# Patient Record
Sex: Male | Born: 1990 | Race: Black or African American | Hispanic: No | Marital: Single | Smoking: Never smoker
Health system: Southern US, Community
[De-identification: ages and names within clinical notes are randomized; demographics above are authoritative.]

## PROBLEM LIST (undated history)

## (undated) DIAGNOSIS — J45909 Unspecified asthma, uncomplicated: Secondary | ICD-10-CM

## (undated) HISTORY — DX: Unspecified asthma, uncomplicated: J45.909

---
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
## 2018-04-03 ENCOUNTER — Ambulatory Visit: Payer: Self-pay | Admitting: Family Medicine

## 2018-04-17 ENCOUNTER — Ambulatory Visit: Payer: Self-pay | Admitting: Family Medicine

## 2018-05-01 ENCOUNTER — Ambulatory Visit: Payer: Federal, State, Local not specified - PPO | Admitting: Family Medicine

## 2018-05-01 ENCOUNTER — Encounter: Payer: Self-pay | Admitting: Family Medicine

## 2018-05-01 VITALS — BP 120/80 | HR 64 | Temp 98.2°F | Ht 71.0 in | Wt 197.0 lb

## 2018-05-01 DIAGNOSIS — Z131 Encounter for screening for diabetes mellitus: Secondary | ICD-10-CM | POA: Diagnosis not present

## 2018-05-01 DIAGNOSIS — Z13220 Encounter for screening for lipoid disorders: Secondary | ICD-10-CM

## 2018-05-01 DIAGNOSIS — Z1388 Encounter for screening for disorder due to exposure to contaminants: Secondary | ICD-10-CM | POA: Diagnosis not present

## 2018-05-01 DIAGNOSIS — Z0000 Encounter for general adult medical examination without abnormal findings: Secondary | ICD-10-CM

## 2018-05-01 DIAGNOSIS — M25811 Other specified joint disorders, right shoulder: Secondary | ICD-10-CM

## 2018-05-01 LAB — BASIC METABOLIC PANEL
BUN: 15 mg/dL (ref 6–23)
CO2: 29 mEq/L (ref 19–32)
Calcium: 9.6 mg/dL (ref 8.4–10.5)
Chloride: 103 mEq/L (ref 96–112)
Creatinine, Ser: 1.21 mg/dL (ref 0.40–1.50)
GFR: 90.94 mL/min (ref >60.00)
Glucose, Bld: 92 mg/dL (ref 70–99)
Potassium: 4.3 mEq/L (ref 3.5–5.1)
Sodium: 140 mEq/L (ref 135–145)

## 2018-05-01 LAB — LIPID PANEL
Cholesterol: 152 mg/dL (ref 0–200)
HDL: 62.2 mg/dL (ref >39.00)
LDL Cholesterol: 82 mg/dL (ref 0–99)
NonHDL: 89.3
Total CHOL/HDL Ratio: 2
Triglycerides: 37 mg/dL (ref 0.0–149.0)
VLDL: 7.4 mg/dL (ref 0.0–40.0)

## 2018-05-01 LAB — CBC
HCT: 44.6 % (ref 39.0–52.0)
Hemoglobin: 14.7 g/dL (ref 13.0–17.0)
MCHC: 33 g/dL (ref 30.0–36.0)
MCV: 99 fl (ref 78.0–100.0)
Platelets: 183 10*3/uL (ref 150.0–400.0)
RBC: 4.51 Mil/uL (ref 4.22–5.81)
RDW: 12.7 % (ref 11.5–15.5)
WBC: 3.7 10*3/uL — ABNORMAL LOW (ref 4.0–10.5)

## 2018-05-01 LAB — HEMOGLOBIN A1C: Hgb A1c MFr Bld: 4.8 % (ref 4.6–6.5)

## 2018-05-01 NOTE — Progress Notes (Signed)
Subjective:     Jared Grant is a 27 y.o. male and is here for a comprehensive physical exam. The patient reports problems - R shoulder click.  Pt notes R shoulder clicking and soreness with overuse.  No pain or discomfort with regular activity.  Pt does not recall initial injury, but states he works out often and does MMA.  Pt has not taken anything for his symptoms.  Allergies: NKDA  Past Surg Hx:  Wisdom teeth extraction  Social Hx: Pt is single.  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  Pt recently moved to the area from [place], [place].  He is currently employed by the [employer].  Pt expresses concerns about his lead levels as he has been doing a lot of shooting at the range.  Pt endorses social EtOH use.  Pt denies tobacco and drug use.  Social History   Socioeconomic History  . Marital status: Single    Spouse name: Not on file  . Number of children: Not on file  . Years of education: Not on file  . Highest education level: Not on file  Occupational History  . Not on file  Social Needs  . Financial resource strain: Not on file  . Food insecurity:    Worry: Not on file    Inability: Not on file  . Transportation needs:    Medical: Not on file    Non-medical: Not on file  Tobacco Use  . Smoking status: Never Smoker  . Smokeless tobacco: Never Used  Substance and Sexual Activity  . Alcohol use: Yes  . Drug use: Never  . Sexual activity: Yes  Lifestyle  . Physical activity:    Days per week: Not on file    Minutes per session: Not on file  . Stress: Not on file  Relationships  . Social connections:    Talks on phone: Not on file    Gets together: Not on file    Attends religious service: Not on file    Active member of club or organization: Not on file    Attends meetings of clubs or organizations: Not on file    Relationship status: Not on file  . Intimate partner violence:    Fear of current or ex partner: Not on file    Emotionally abused: Not on file    Physically  abused: Not on file    Forced sexual activity: Not on file  Other Topics Concern  . Not on file  Social History Narrative  . Not on file   Health Maintenance  Topic Date Due  . HIV Screening  12/14/2003  . TETANUS/TDAP  12/14/2007  . INFLUENZA VACCINE  02/20/2018    The following portions of the patient's history were reviewed and updated as appropriate: allergies, current medications, past family history, past medical history, past social history, past surgical history and problem list.  Review of Systems Pertinent items noted in HPI and remainder of comprehensive ROS otherwise negative.   Objective:    BP 120/80 (BP Location: Left Arm, Patient Position: Sitting, Cuff Size: Normal)   Pulse 64   Temp 98.2 F (36.8 C) (Oral)   Ht 5\' 11"  (1.803 m)   Wt 197 lb (89.4 kg)   SpO2 99%   BMI 27.48 kg/m  General appearance: alert, cooperative, appears stated age and no distress Head: Normocephalic, without obvious abnormality, atraumatic Eyes: conjunctivae/corneas clear. PERRL, EOM's intact. Fundi benign. Ears: normal TM's and external ear canals both ears Nose:  Nares normal. Septum midline. Mucosa normal. No drainage or sinus tenderness. Throat: lips, mucosa, and tongue normal; teeth and gums normal Neck: no adenopathy, no carotid bruit, no JVD, supple, symmetrical, trachea midline and thyroid not enlarged, symmetric, no tenderness/mass/nodules Lungs: clear to auscultation bilaterally Heart: regular rate and rhythm, S1, S2 normal, no murmur, click, rub or gallop Abdomen: soft, non-tender; bowel sounds normal; no masses,  no organomegaly Extremities: extremities normal, atraumatic, no cyanosis or edema Skin: Skin color, texture, turgor normal. No rashes or lesions Neurologic: Alert and oriented X 3, normal strength and tone. Normal symmetric reflexes. Normal coordination and gait    Assessment:    Healthy male exam.      Plan:     Anticipatory guidance given including wearing  seatbelts, smoke detectors in the home, increasing physical activity, increasing p.o. intake of water and vegetables. -will obtain labs: CBC, BMP, lipids, Hgb A1C -offered influenza vaccine, pt declined -given handout See After Visit Summary for Counseling Recommendations    Lead Screen -will screen given increased exposure at range  R shoulder click -normal exam -discussed various causes of clicking including labrum tear -will do conservative management including NSAIDs, Ice, stretching/exercises -discussed imaging for continued or worsened symptoms  F/u prn  Abbe Amsterdam, MD

## 2018-05-01 NOTE — Patient Instructions (Addendum)
Preventive Care 18-39 Years, Male Preventive care refers to lifestyle choices and visits with your health care provider that can promote health and wellness. What does preventive care include?  A yearly physical exam. This is also called an annual well check.  Dental exams once or twice a year.  Routine eye exams. Ask your health care provider how often you should have your eyes checked.  Personal lifestyle choices, including: ? Daily care of your teeth and gums. ? Regular physical activity. ? Eating a healthy diet. ? Avoiding tobacco and drug use. ? Limiting alcohol use. ? Practicing safe sex. What happens during an annual well check? The services and screenings done by your health care provider during your annual well check will depend on your age, overall health, lifestyle risk factors, and family history of disease. Counseling Your health care provider may ask you questions about your:  Alcohol use.  Tobacco use.  Drug use.  Emotional well-being.  Home and relationship well-being.  Sexual activity.  Eating habits.  Work and work Statistician.  Screening You may have the following tests or measurements:  Height, weight, and BMI.  Blood pressure.  Lipid and cholesterol levels. These may be checked every 5 years starting at age 58.  Diabetes screening. This is done by checking your blood sugar (glucose) after you have not eaten for a while (fasting).  Skin check.  Hepatitis C blood test.  Hepatitis B blood test.  Sexually transmitted disease (STD) testing.  Discuss your test results, treatment options, and if necessary, the need for more tests with your health care provider. Vaccines Your health care provider may recommend certain vaccines, such as:  Influenza vaccine. This is recommended every year.  Tetanus, diphtheria, and acellular pertussis (Tdap, Td) vaccine. You may need a Td booster every 10 years.  Varicella vaccine. You may need this if you  have not been vaccinated.  HPV vaccine. If you are 44 or younger, you may need three doses over 6 months.  Measles, mumps, and rubella (MMR) vaccine. You may need at least one dose of MMR.You may also need a second dose.  Pneumococcal 13-valent conjugate (PCV13) vaccine. You may need this if you have certain conditions and have not been vaccinated.  Pneumococcal polysaccharide (PPSV23) vaccine. You may need one or two doses if you smoke cigarettes or if you have certain conditions.  Meningococcal vaccine. One dose is recommended if you are age 30-21 years and a first-year college student living in a residence hall, or if you have one of several medical conditions. You may also need additional booster doses.  Hepatitis A vaccine. You may need this if you have certain conditions or if you travel or work in places where you may be exposed to hepatitis A.  Hepatitis B vaccine. You may need this if you have certain conditions or if you travel or work in places where you may be exposed to hepatitis B.  Haemophilus influenzae type b (Hib) vaccine. You may need this if you have certain risk factors.  Talk to your health care provider about which screenings and vaccines you need and how often you need them. This information is not intended to replace advice given to you by your health care provider. Make sure you discuss any questions you have with your health care provider. Document Released: 09/04/2001 Document Revised: 03/28/2016 Document Reviewed: 05/10/2015 Elsevier Interactive Patient Education  2018 [place] What is lead poisoning? Lead is a substance that occurs naturally  in the earth. It can be found in soil, air, and water. Lead is very poisonous (toxic) to humans. Lead poisoning occurs when there is enough lead in a person's body to be harmful to his or her health. A person can get lead poisoning from:  Lead paint.  Small particles of lead in the soil  or air.  Water that is contaminated with lead from the soil or from lead pipes.  Working at a job where lead is used.  Having hobbies that involve the use of lead.  Lead poisoning is usually diagnosed with a blood test. The amount of lead in the blood is measured in micrograms per deciliter (mcg/dL). A child with a blood level of 5 mcg/dL or higher may be at risk for lead poisoning. A high level for an adult is 45 mcg/dL. How does lead poisoning happen? Lead poisoning can happen when a person swallows or breathes in lead. It is possible to get lead poisoning from a single exposure to a high amount of lead. Usually, lead slowly builds up in the body over time until it reaches a dangerous level. Lead can affect many areas of the body. Products that are made with lead may get into the air or the soil. Lead that gets into the soil may get into drinking water. Lead is also used to make many commonly used products. What are some common sources of lead? Lead-based paint has been the most common source of lead for many years. Most paint no longer contains lead. However, houses that were built before 1978 may have been painted with lead-based paint. Sources of lead that result from this include:  Soil outside of old houses.  Dust or paint chips inside old houses.  Lead in soil and dust where old houses are being renovated or demolished.  Vacant lots where old buildings once stood.  Other possible sources of lead exposure include:  Old painted toys or furniture.  Bullets or fishing sinkers.  Pipes or faucets.  Stained glass windows.  Materials used in [employer], Teacher, adult education, glazing, Insurance claims handler.  Batteries.  Ceramics.  Pewter pitchers and dinnerware.  Hair dyes.  Air, soil, or water near industrial sites.  Areas where lead has been mined, smelted, or refined.  Who is at risk for lead poisoning? Children are most at risk for lead poisoning because:  They are more likely to eat  lead paint chips or put objects that contain lead into their mouths.  They absorb lead into their systems more quickly than adults do.  Their bodies and brains are still developing, so they are more vulnerable to the toxic effects of lead.  Pregnant women and babies in the womb also have a higher-than-normal risk for lead poisoning. Lead that builds up in the blood over many years is stored in the bones along with calcium. Pregnancy causes calcium to be released into the blood. Lead may also be released. This release of lead can be toxic to a pregnant woman and can also affect a developing baby. Adults may be more at risk if they work in jobs where lead exposure is common. These include house remodeling or demolition, welding, bridge or water tower maintenance, ammunition manufacturing, and jobs that involve regular exposure to car batteries. Adults may also have an increased risk of lead poisoning if they have hobbies that involve using lead for soldering or glazing. What are the symptoms of lead poisoning? Symptoms are different for children, pregnant women, and other adults. Symptoms in  children  Slowed growth.  Learning problems.  Hyperactivity or behavior problems.  Low red blood cell count (anemia).  Reduced hearing.  Kidney damage.  Seizures. Symptoms in pregnant women  Giving birth too early (prematurely).  Having a baby with a low birth weight. Symptoms in other adults  High blood pressure.  Kidney damage.  Infertility.  Nerve, muscle, or joint problems.  Irritability.  Memory loss or concentration problems. How is lead poisoning treated? Treatment includes removing the sources of lead in the environment. Lead can also be removed from the body by taking a type of medicine that binds to the lead so it can be eliminated in urine (chelation therapy). What can I do to prevent lead poisoning?  Have children tested for lead.  Have your house checked for lead paint,  especially if you live in a house or an apartment that was built before 1978.  Children and pregnant women should stay out of any house built before 1978 that is being renovated.  Do not let children play in the dirt, especially in vacant lots.  Do not store food or drink in ceramic pottery that may have lead glazes.  For drinking or cooking, use only cold water from your tap or use bottled water. Hot water has more dissolved lead.  Run tap water for a minute before using it for cooking or drinking.  Mop your floors often.  Wash your child's hands and face before he or she eats.  Wash any toys that children may put into their mouths.  Do not store alcoholic drinks in lead crystal decanters. Where can I find more information? Environmental Protection Agency: [employer] This information is not intended to replace advice given to you by your health care provider. Make sure you discuss any questions you have with your health care provider. Document Released: 08/16/2004 Document Revised: 06/04/2016 Document Reviewed: 07/14/2014 Elsevier Interactive Patient Education  2017 [employer].

## 2018-05-05 LAB — LEAD, BLOOD (ADULT >= 16 YRS): Lead: 3 ug/dL (ref <5)

## 2019-11-25 ENCOUNTER — Other Ambulatory Visit: Payer: Self-pay

## 2019-11-25 ENCOUNTER — Encounter: Payer: Self-pay | Admitting: Family Medicine

## 2019-11-25 ENCOUNTER — Ambulatory Visit (INDEPENDENT_AMBULATORY_CARE_PROVIDER_SITE_OTHER): Payer: Federal, State, Local not specified - PPO | Admitting: Family Medicine

## 2019-11-25 VITALS — BP 110/76 | HR 59 | Temp 97.7°F | Wt 207.0 lb

## 2019-11-25 DIAGNOSIS — J4520 Mild intermittent asthma, uncomplicated: Secondary | ICD-10-CM

## 2019-11-25 DIAGNOSIS — J302 Other seasonal allergic rhinitis: Secondary | ICD-10-CM

## 2019-11-25 MED ORDER — CETIRIZINE HCL 10 MG PO TABS: 10.0000 mg | ORAL_TABLET | Freq: Every day | ORAL | 11 refills | Status: AC

## 2019-11-25 MED ORDER — ALBUTEROL SULFATE HFA 108 (90 BASE) MCG/ACT IN AERS: 2.0000 | INHALATION_SPRAY | Freq: Four times a day (QID) | RESPIRATORY_TRACT | 5 refills | Status: AC | PRN

## 2019-11-25 NOTE — Progress Notes (Signed)
Subjective:    Patient ID: Jared Grant, male    DOB: Jul 22, 1991, 29 y.o.   MRN: [id number]  No chief complaint on file.   HPI Pt seen today for ongoing concern. Pt notes wheezing and occasional cough mostly at night.  May have symptoms few nights per week.  Pt has a h/o asthma with symptoms occurring mostly as a child.  Also notes allergy symptoms including post nasal drainage.  Not currently taking anything.  Past Medical History:  Diagnosis Date  . Asthma     No Known Allergies  ROS General: Denies fever, chills, night sweats, changes in weight, changes in appetite HEENT: Denies headaches, ear pain, changes in vision, rhinorrhea, sore throat +post nasal drainage CV: Denies CP, palpitations, SOB, orthopnea Pulm: Denies SOB   +cough, wheezing GI: Denies abdominal pain, nausea, vomiting, diarrhea, constipation GU: Denies dysuria, hematuria, frequency, vaginal discharge Msk: Denies muscle cramps, joint pains Neuro: Denies weakness, numbness, tingling Skin: Denies rashes, bruising Psych: Denies depression, anxiety, hallucinations     Objective:    Blood pressure 110/76, pulse (!) 59, temperature 97.7 F (36.5 C), temperature source Temporal, weight 207 lb (93.9 kg), SpO2 96 %.  Gen. Pleasant, well-nourished, in no distress, normal affect   HEENT: [place]/AT, face symmetric, conjunctiva clear, no scleral icterus, PERRLA, EOMI, nares patent without drainage, pharynx without erythema or exudate. TMs normal b/l. Lungs: no accessory muscle use, CTAB, no wheezes or rales Cardiovascular: RRR, no m/r/g, no peripheral edema Musculoskeletal: No deformities, no cyanosis or clubbing, normal tone Neuro:  A&Ox3, CN II-XII intact, normal gait Skin:  Warm, no lesions/ rash  Wt Readings from Last 3 Encounters:  05/01/18 197 lb (89.4 kg)    Lab Results  Component Value Date   WBC 3.7 (L) 05/01/2018   HGB 14.7 05/01/2018   HCT 44.6 05/01/2018   PLT 183.0 05/01/2018   GLUCOSE 92  05/01/2018   CHOL 152 05/01/2018   TRIG 37.0 05/01/2018   HDL 62.20 05/01/2018   LDLCALC 82 05/01/2018   NA 140 05/01/2018   K 4.3 05/01/2018   CL 103 05/01/2018   CREATININE 1.21 05/01/2018   BUN 15 05/01/2018   CO2 29 05/01/2018   HGBA1C 4.8 05/01/2018    Assessment/Plan:  Mild intermittent asthma without complication  -Discussed prevention including controlling allergy symptoms -Discussed other possible triggers including pet dander, smoke, dry air, etc. -Given handout -For continued or increased symptoms discussed daily preventive inhaler - Plan: albuterol (VENTOLIN HFA) 108 (90 Base) MCG/ACT inhaler  Seasonal allergies  -Discussed supportive care including local honey, nasal saline -We will try Zyrtec as needed - Plan: cetirizine (ZYRTEC) 10 MG tablet  F/u as needed in the next few weeks for CPE.  Abbe Amsterdam, MD

## 2019-11-25 NOTE — Patient Instructions (Addendum)
[email or web address]  Asthma, Adult  Asthma is a long-term (chronic) condition that causes recurrent episodes in which the airways become tight and narrow. The airways are the passages that lead from the nose and mouth down into the lungs. Asthma episodes, also called asthma attacks, can cause coughing, wheezing, shortness of breath, and chest pain. The airways can also fill with mucus. During an attack, it can be difficult to breathe. Asthma attacks can range from minor to life threatening. Asthma cannot be cured, but medicines and lifestyle changes can help control it and treat acute attacks. What are the causes? This condition is believed to be caused by inherited (genetic) and environmental factors, but its exact cause is not known. There are many things that can bring on an asthma attack or make asthma symptoms worse (triggers). Asthma triggers are different for each person. Common triggers include:  Mold.  Dust.  Cigarette smoke.  Cockroaches.  Things that can cause allergy symptoms (allergens), such as animal dander or pollen from trees or grass.  Air pollutants such as household cleaners, wood smoke, smog, or chemical odors.  Cold air, weather changes, and winds (which increase molds and pollen in the air).  Strong emotional expressions such as crying or laughing hard.  Stress.  Certain medicines (such as aspirin) or types of medicines (such as beta-blockers).  Sulfites in foods and drinks. Foods and drinks that may contain sulfites include dried fruit, potato chips, and sparkling grape juice.  Infections or inflammatory conditions such as the flu, a cold, or inflammation of the nasal membranes (rhinitis).  Gastroesophageal reflux disease (GERD).  Exercise or strenuous activity. What are the signs or symptoms? Symptoms of this condition may occur right after asthma is triggered or many hours later. Symptoms include:  Wheezing. This can  sound like whistling when you breathe.  Excessive nighttime or early morning coughing.  Frequent or severe coughing with a common cold.  Chest tightness.  Shortness of breath.  Tiredness (fatigue) with minimal activity. How is this diagnosed? This condition is diagnosed based on:  Your medical history.  A physical exam.  Tests, which may include: ? Lung function studies and pulmonary studies (spirometry). These tests can evaluate the flow of air in your lungs. ? Allergy tests. ? Imaging tests, such as X-rays. How is this treated? There is no cure for this condition, but treatment can help control your symptoms. Treatment for asthma usually involves:  Identifying and avoiding your asthma triggers.  Using medicines to control your symptoms. Generally, two types of medicines are used to treat asthma: ? Controller medicines. These help prevent asthma symptoms from occurring. They are usually taken every day. ? Fast-acting reliever or rescue medicines. These quickly relieve asthma symptoms by widening the narrow and tight airways. They are used as needed and provide short-term relief.  Using supplemental oxygen. This may be needed during a severe episode.  Using other medicines, such as: ? Allergy medicines, such as antihistamines, if your asthma attacks are triggered by allergens. ? Immune medicines (immunomodulators). These are medicines that help control the immune system.  Creating an asthma action plan. An asthma action plan is a written plan for managing and treating your asthma attacks. This plan includes: ? A list of your asthma triggers and how to avoid them. ? Information about when medicines should be taken and when their dosage should be changed. ? Instructions about using a device called a peak flow meter. A peak flow meter measures how well the lungs are working   and the severity of your asthma. It helps you monitor your condition. Follow these instructions at  home: Controlling your home environment Control your home environment in the following ways to help avoid triggers and prevent asthma attacks:  Change your heating and air conditioning filter regularly.  Limit your use of fireplaces and wood stoves.  Get rid of pests (such as roaches and mice) and their droppings.  Throw away plants if you see mold on them.  Clean floors and dust surfaces regularly. Use unscented cleaning products.  Try to have someone else vacuum for you regularly. Stay out of rooms while they are being vacuumed and for a short while afterward. If you vacuum, use a dust mask from a hardware store, a double-layered or microfilter vacuum cleaner bag, or a vacuum cleaner with a HEPA filter.  Replace carpet with wood, tile, or vinyl flooring. Carpet can trap dander and dust.  Use allergy-proof pillows, mattress covers, and box spring covers.  Keep your bedroom a trigger-free room.  Avoid pets and keep windows closed when allergens are in the air.  Wash beddings every week in hot water and dry them in a dryer.  Use blankets that are made of polyester or cotton.  Clean bathrooms and kitchens with bleach. If possible, have someone repaint the walls in these rooms with mold-resistant paint. Stay out of the rooms that are being cleaned and painted.  Wash your hands often with soap and water. If soap and water are not available, use hand sanitizer.  Do not allow anyone to smoke in your home. General instructions  Take over-the-counter and prescription medicines only as told by your health care provider. ? Speak with your health care provider if you have questions about how or when to take the medicines. ? Make note if you are requiring more frequent dosages.  Do not use any products that contain nicotine or tobacco, such as cigarettes and e-cigarettes. If you need help quitting, ask your health care provider. Also, avoid being exposed to secondhand smoke.  Use a peak  flow meter as told by your health care provider. Record and keep track of the readings.  Understand and use the asthma action plan to help minimize, or stop an asthma attack, without needing to seek medical care.  Make sure you stay up to date on your yearly vaccinations as told by your health care provider. This may include vaccines for the flu and pneumonia.  Avoid outdoor activities when allergen counts are high and when air quality is low.  Wear a ski mask that covers your nose and mouth during outdoor winter activities. Exercise indoors on cold days if you can.  Warm up before exercising, and take time for a cool-down period after exercise.  Keep all follow-up visits as told by your health care provider. This is important. Where to find more information  For information about asthma, turn to the Centers for Disease Control and Prevention at www.cdc.gov/asthma/faqs.htm  For air quality information, turn to AirNow at https://airnow.gov/ Contact a health care provider if:  You have wheezing, shortness of breath, or a cough even while you are taking medicine to prevent attacks.  The mucus you cough up (sputum) is thicker than usual.  Your sputum changes from clear or white to yellow, green, gray, or bloody.  Your medicines are causing side effects, such as a rash, itching, swelling, or trouble breathing.  You need to use a reliever medicine more than 2-3 times a week.  Your peak   flow reading is still at 50-79% of your personal best after following your action plan for 1 hour.  You have a fever. Get help right away if:  You are getting worse and do not respond to treatment during an asthma attack.  You are short of breath when at rest or when doing very little physical activity.  You have difficulty eating, drinking, or talking.  You have chest pain or tightness.  You develop a fast heartbeat or palpitations.  You have a bluish color to your lips or fingernails.  You  are light-headed or dizzy, or you faint.  Your peak flow reading is less than 50% of your personal best.  You feel too tired to breathe normally. Summary  Asthma is a long-term (chronic) condition that causes recurrent episodes in which the airways become tight and narrow. These episodes can cause coughing, wheezing, shortness of breath, and chest pain.  Asthma cannot be cured, but medicines and lifestyle changes can help control it and treat acute attacks.  Make sure you understand how to avoid triggers and how and when to use your medicines.  Asthma attacks can range from minor to life threatening. Get help right away if you have an asthma attack and do not respond to treatment with your usual rescue medicines. This information is not intended to replace advice given to you by your health care provider. Make sure you discuss any questions you have with your health care provider. Document Revised: 09/11/2018 Document Reviewed: 08/13/2016 Elsevier Patient Education  2020 [employer]  Allergies, Adult An allergy is when your body's defense system (immune system) overreacts to an otherwise harmless substance (allergen) that you breathe in or eat or something that touches your skin. When you come into contact with something that you are allergic to, your immune system produces certain proteins (antibodies). These proteins cause cells to release chemicals (histamines) that trigger the symptoms of an allergic reaction. Allergies often affect the nasal passages (allergic rhinitis), eyes (allergic conjunctivitis), skin (atopic dermatitis), and stomach. Allergies can be mild or severe. Allergies cannot spread from person to person (are not contagious). They can develop at any age and may be outgrown. What increases the risk? You may be at greater risk of allergies if other people in your family have allergies. What are the signs or symptoms? Symptoms depend on what type of allergy you have. They  may include:  Runny, stuffy nose.  Sneezing.  Itchy mouth, ears, or throat.  Postnasal drip.  Sore throat.  Itchy, red, watery, or puffy eyes.  Skin rash or hives.  Stomach pain.  Vomiting.  Diarrhea.  Bloating.  Wheezing or coughing. People with a severe allergy to food, medicine, or an insect bite may have a life-threatening allergic reaction (anaphylaxis). Symptoms of anaphylaxis include:  Hives.  Itching.  Flushed face.  Swollen lips, tongue, or mouth.  Tight or swollen throat.  Chest pain or tightness in the chest.  Trouble breathing or shortness of breath.  Rapid heartbeat.  Dizziness or fainting.  Vomiting.  Diarrhea.  Pain in the abdomen. How is this diagnosed? This condition is diagnosed based on:  Your symptoms.  Your family and medical history.  A physical exam. You may need to see a health care provider who specializes in treating allergies (allergist). You may also have tests, including:  Skin tests to see which allergens are causing your symptoms, such as: ? Skin prick test. In this test, your skin is pricked with a tiny needle and  exposed to small amounts of possible allergens to see if your skin reacts. ? Intradermal skin test. In this test, a small amount of allergen is injected under your skin to see if your skin reacts. ? Patch test. In this test, a small amount of allergen is placed on your skin and then your skin is covered with a bandage. Your health care provider will check your skin after a couple of days to see if a rash has developed.  Blood tests.  Challenges tests. In this test, you inhale a small amount of allergen by mouth to see if you have an allergic reaction. You may also be asked to:  Keep a food diary. A food diary is a record of all the foods and drinks you have in a day and any symptoms you experience.  Practice an elimination diet. An elimination diet involves eliminating specific foods from your diet and  then adding them back in one by one to find out if a certain food causes an allergic reaction. How is this treated? Treatment for allergies depends on your symptoms. Treatment may include:  Cold compresses to soothe itching and swelling.  Eye drops.  Nasal sprays.  Using a saline spray or container (neti pot) to flush out the nose (nasal irrigation). These methods can help clear away mucus and keep the nasal passages moist.  Using a humidifier.  Oral antihistamines or other medicines to block allergic reaction and inflammation.  Skin creams to treat rashes or itching.  Diet changes to eliminate food allergy triggers.  Repeated exposure to tiny amounts of allergens to build up a tolerance and prevent future allergic reactions (immunotherapy). These include: ? Allergy shots. ? Oral treatment. This involves taking small doses of an allergen under the tongue (sublingual immunotherapy).  Emergency epinephrine injection (auto-injector) in case of an allergic emergency. This is a self-injectable, pre-measured medicine that must be given within the first few minutes of a serious allergic reaction. Follow these instructions at home:         Avoid known allergens whenever possible.  If you suffer from airborne allergens, wash out your nose daily. You can do this with a saline spray or a neti pot to flush out your nose (nasal irrigation).  Take over-the-counter and prescription medicines only as told by your health care provider.  Keep all follow-up visits as told by your health care provider. This is important.  If you are at risk of a severe allergic reaction (anaphylaxis), keep your auto-injector with you at all times.  If you have ever had anaphylaxis, wear a medical alert bracelet or necklace that states you have a severe allergy. Contact a health care provider if:  Your symptoms do not improve with treatment. Get help right away if:  You have symptoms of anaphylaxis, such  as: ? Swollen mouth, tongue, or throat. ? Pain or tightness in your chest. ? Trouble breathing or shortness of breath. ? Dizziness or fainting. ? Severe abdominal pain, vomiting, or diarrhea. This information is not intended to replace advice given to you by your health care provider. Make sure you discuss any questions you have with your health care provider. Document Revised: 10/02/2017 Document Reviewed: 01/25/2016 Elsevier Patient Education  [place].

## 2019-12-02 ENCOUNTER — Encounter: Payer: Federal, State, Local not specified - PPO | Admitting: Family Medicine

## 2019-12-31 ENCOUNTER — Encounter: Payer: Federal, State, Local not specified - PPO | Admitting: Family Medicine

## 2020-01-27 ENCOUNTER — Encounter: Payer: Federal, State, Local not specified - PPO | Admitting: Family Medicine

## 2020-04-20 DIAGNOSIS — H9313 Tinnitus, bilateral: Secondary | ICD-10-CM | POA: Diagnosis not present

## 2020-05-10 ENCOUNTER — Telehealth: Payer: Self-pay | Admitting: Family Medicine

## 2020-05-10 NOTE — Telephone Encounter (Signed)
Pt brought physical form for Dr Salomon Fick to complete and call pt when ready for pick up.  Pt has not had CPE this year. Scheduled appt & forwarded forms to provider's assistant for upcoming appt.

## 2020-05-12 NOTE — Telephone Encounter (Signed)
Pt form received and placed on Dr Salomon Fick folder for completing after pt appointment

## 2020-05-13 ENCOUNTER — Other Ambulatory Visit: Payer: Self-pay

## 2020-05-13 ENCOUNTER — Ambulatory Visit (INDEPENDENT_AMBULATORY_CARE_PROVIDER_SITE_OTHER): Payer: Federal, State, Local not specified - PPO | Admitting: Family Medicine

## 2020-05-13 ENCOUNTER — Encounter: Payer: Self-pay | Admitting: Family Medicine

## 2020-05-13 VITALS — BP 116/76 | HR 68 | Temp 97.7°F | Wt 222.6 lb

## 2020-05-13 DIAGNOSIS — Z0000 Encounter for general adult medical examination without abnormal findings: Secondary | ICD-10-CM | POA: Diagnosis not present

## 2020-05-13 NOTE — Patient Instructions (Addendum)
Preventive Care 19-31 Years Old, Male Preventive care refers to lifestyle choices and visits with your health care provider that can promote health and wellness. This includes:  A yearly physical exam. This is also called an annual well check.  Regular dental and eye exams.  Immunizations.  Screening for certain conditions.  Healthy lifestyle choices, such as eating a healthy diet, getting regular exercise, not using drugs or products that contain nicotine and tobacco, and limiting alcohol use. What can I expect for my preventive care visit? Physical exam Your health care provider will check:  Height and weight. These may be used to calculate body mass index (BMI), which is a measurement that tells if you are at a healthy weight.  Heart rate and blood pressure.  Your skin for abnormal spots. Counseling Your health care provider may ask you questions about:  Alcohol, tobacco, and drug use.  Emotional well-being.  Home and relationship well-being.  Sexual activity.  Eating habits.  Work and work Statistician. What immunizations do I need?  Influenza (flu) vaccine  This is recommended every year. Tetanus, diphtheria, and pertussis (Tdap) vaccine  You may need a Td booster every 10 years. Varicella (chickenpox) vaccine  You may need this vaccine if you have not already been vaccinated. Human papillomavirus (HPV) vaccine  If recommended by your health care provider, you may need three doses over 6 months. Measles, mumps, and rubella (MMR) vaccine  You may need at least one dose of MMR. You may also need a second dose. Meningococcal conjugate (MenACWY) vaccine  One dose is recommended if you are 45-76 years old and a Market researcher living in a residence hall, or if you have one of several medical conditions. You may also need additional booster doses. Pneumococcal conjugate (PCV13) vaccine  You may need this if you have certain conditions and were not  previously vaccinated. Pneumococcal polysaccharide (PPSV23) vaccine  You may need one or two doses if you smoke cigarettes or if you have certain conditions. Hepatitis A vaccine  You may need this if you have certain conditions or if you travel or work in places where you may be exposed to hepatitis A. Hepatitis B vaccine  You may need this if you have certain conditions or if you travel or work in places where you may be exposed to hepatitis B. Haemophilus influenzae type b (Hib) vaccine  You may need this if you have certain risk factors. You may receive vaccines as individual doses or as more than one vaccine together in one shot (combination vaccines). Talk with your health care provider about the risks and benefits of combination vaccines. What tests do I need? Blood tests  Lipid and cholesterol levels. These may be checked every 5 years starting at age 17.  Hepatitis C test.  Hepatitis B test. Screening   Diabetes screening. This is done by checking your blood sugar (glucose) after you have not eaten for a while (fasting).  Sexually transmitted disease (STD) testing. Talk with your health care provider about your test results, treatment options, and if necessary, the need for more tests. Follow these instructions at home: Eating and drinking   Eat a diet that includes fresh fruits and vegetables, whole grains, lean protein, and low-fat dairy products.  Take vitamin and mineral supplements as recommended by your health care provider.  Do not drink alcohol if your health care provider tells you not to drink.  If you drink alcohol: ? Limit how much you have to 0-2  drinks a day. ? Be aware of how much alcohol is in your drink. In the [place] one drink equals one 12 oz bottle of beer (355 mL), one 5 oz glass of wine (148 mL), or one 1 oz glass of hard liquor (44 mL). Lifestyle  Take daily care of your teeth and gums.  Stay active. Exercise for at least 30 minutes on 5 or  more days each week.  Do not use any products that contain nicotine or tobacco, such as cigarettes, e-cigarettes, and chewing tobacco. If you need help quitting, ask your health care provider.  If you are sexually active, practice safe sex. Use a condom or other form of protection to prevent STIs (sexually transmitted infections). What's next?  Go to your health care provider once a year for a well check visit.  Ask your health care provider how often you should have your eyes and teeth checked.  Stay up to date on all vaccines. This information is not intended to replace advice given to you by your health care provider. Make sure you discuss any questions you have with your health care provider. Document Revised: 07/03/2018 Document Reviewed: 07/03/2018 Elsevier Patient Education  2020 [employer] Inc.  Preventing Vitamin D Deficiency Vitamin D is a nutrient that helps your body absorb calcium from food. It plays a key role in the health of bones and teeth, muscle function, and infection prevention. Our bodies make vitamin D when our skin is exposed to direct sunlight. However, for many people, this may not be enough vitamin D to meet the body's needs. When you get too little vitamin D, it is called a deficiency. How can this condition affect me? A vitamin D deficiency can put you at risk of developing conditions that cause bones to be brittle, such as rickets or osteoporosis. If you are over age 50, not having enough vitamin D may weaken your muscles and bones and increase your risk for falls and broken bones. What can increase my risk? You may be at risk for a vitamin D deficiency if you:  Are pregnant.  Are obese.  Are over 24 years old.  Have dark skin.  Take certain medicines that affect the way vitamin D is absorbed.  Have had gastric bypass surgery. Other risk factors include:  Having a condition that limits your ability to absorb fat, such as cystic fibrosis, celiac  disease, or inflammatory bowel disease.  Having certain inherited conditions.  Not having access to foods rich in vitamin D.  Having limited ability to move.  Living in areas that have fewer hours of sunlight.  Spending most of your day indoors, or you cover your skin all the time when you are outdoors. Breastfed infants are also at risk for vitamin D deficiency. What actions can I take to reduce my risk of a vitamin D deficiency? Knowing the best sources of vitamin D You can meet your daily vitamin D needs from:  Foods.  Dietary supplements.  Direct exposure to natural sunlight.  Infant formula (for babies). Knowing how much vitamin D you need General recommendations for daily vitamin D intake vary by these categories:  Infants: 400 International Units.  Children over 33 year old: 600 International Units.  Adults: 600 International Units.  Pregnant and breastfeeding women: 600 International Units.  Adults over 63 years old: 800 International Units. These are minimum levels of recommended amounts. Your health care provider may recommend a different amount of vitamin D intake based on your specific needs  and your overall health. Getting sun exposure  Get regular, safe exposure to natural sunlight. Expose your skin to direct sunlight for at least 15 minutes every day. If you have dark skin, you may need to expose your skin for a longer period of time.  Protect your skin from too much sun exposure. This helps to prevent skin cancer.  Ask your health care provider if regular sun exposure is safe for you.  Do not use a tanning bed. Eating and drinking   Eat foods that naturally contain vitamin D. These include: ? Beef liver. ? Egg yolk. ? Fatty fish, such as cod, salmon, trout, swordfish, shrimp, sardines, and tuna. ? Cheese. ? Mushrooms. ? Oysters.  Eat or drink products that have been fortified with vitamin D. Fortified means that vitamin D has been added to the  food. These may include: ? Cereals. ? Dairy products, such as milk, yogurt, butter, or margarine. ? Orange juice. ? Alternative milks, such as soy milk or almond milk.  When choosing foods, check the food label on the package to see: ? How much vitamin D is in the item. ? If the food is fortified with vitamin D.  Although it is hard to get your vitamin D requirement from foods alone, you should eat a balanced diet each day that includes foods naturally higher in vitamin D or fortified with it. Try to include the following in your diet each day: ? 2-3 servings of meat or meat alternatives. ? 2-3 servings of dairy. Taking supplements If you are at risk for vitamin D deficiency, or if you have certain diseases, your health care provider may recommend that you take a vitamin D supplement. Make sure you:  Talk with your health care provider before you start taking any vitamin D supplements. You may be more sensitive to the side effects of vitamin D supplements if you are on certain medicines or have certain medical conditions.  Tell your health care provider about all medicines you are taking, including vitamin, mineral, and herbal supplements.  Take medicines and supplements only as told by your health care provider. Summary  Vitamin D is a nutrient that helps your body absorb calcium from food.  A vitamin D deficiency can put you at risk of developing conditions that cause bones to be brittle, such as rickets or osteoporosis.  Our bodies make vitamin D when our skin is exposed to direct sunlight. However, for many people, this may not be enough vitamin D to meet the body's needs.  Some foods naturally contain vitamin D, including beef liver, egg yolk, and fatty fish.  Products may also be fortified with vitamin D. Fortified means that vitamin D has been added to the food. This information is not intended to replace advice given to you by your health care provider. Make sure you discuss  any questions you have with your health care provider. Document Revised: 04/01/2019 Document Reviewed: 07/04/2018 Elsevier Patient Education  [place].

## 2020-05-13 NOTE — Progress Notes (Signed)
Subjective:     Kanton Kamel is a 29 y.o. male and is here for a comprehensive physical exam. The patient reports no problems.  Patient has a health form for work with the [employer] so he can exercise.  Pt taking MVI daily and Vit D 5,000 IUs daily.  Pt states he has not had to use the inhaler he was given at last [facility] for mild asthma. Patient endorses receiving COVID-19 vaccinations.  Social History   Socioeconomic History  . Marital status: Single    Spouse name: Not on file  . Number of children: Not on file  . Years of education: Not on file  . Highest education level: Not on file  Occupational History  . Not on file  Tobacco Use  . Smoking status: Never Smoker  . Smokeless tobacco: Never Used  Substance and Sexual Activity  . Alcohol use: Yes  . Drug use: Never  . Sexual activity: Yes  Other Topics Concern  . Not on file  Social History Narrative  . Not on file   Social Determinants of Health   Financial Resource Strain:   . Difficulty of Paying Living Expenses: Not on file  Food Insecurity:   . Worried About Programme researcher, broadcasting/film/video in the Last Year: Not on file  . Ran Out of Food in the Last Year: Not on file  Transportation Needs:   . Lack of Transportation (Medical): Not on file  . Lack of Transportation (Non-Medical): Not on file  Physical Activity:   . Days of Exercise per Week: Not on file  . Minutes of Exercise per Session: Not on file  Stress:   . Feeling of Stress : Not on file  Social Connections:   . Frequency of Communication with Friends and Family: Not on file  . Frequency of Social Gatherings with Friends and Family: Not on file  . Attends Religious Services: Not on file  . Active Member of Clubs or Organizations: Not on file  . Attends Banker Meetings: Not on file  . Marital Status: Not on file  Intimate Partner Violence:   . Fear of Current or Ex-Partner: Not on file  . Emotionally Abused: Not on file  . Physically Abused: Not on  file  . Sexually Abused: Not on file   Health Maintenance  Topic Date Due  . Hepatitis C Screening  Never done  . HIV Screening  Never done  . TETANUS/TDAP  Never done  . INFLUENZA VACCINE  Never done    The following portions of the patient's history were reviewed and updated as appropriate: allergies, current medications, past family history, past medical history, past social history, past surgical history and problem list.  Review of Systems A comprehensive review of systems was negative.   Objective:    BP 116/76 (BP Location: Right Arm, Patient Position: Sitting, Cuff Size: Normal)   Pulse 68   Temp 97.7 F (36.5 C) (Oral)   Wt 222 lb 9.6 oz (101 kg)   SpO2 98%   BMI 31.05 kg/m  General appearance: alert, cooperative and no distress Head: Normocephalic, without obvious abnormality, atraumatic Eyes: conjunctivae/corneas clear. PERRL, EOM's intact. Fundi benign. Ears: normal TM's and external ear canals both ears Nose: Nares normal. Septum midline. Mucosa normal. No drainage or sinus tenderness. Throat: lips, mucosa, and tongue normal; teeth and gums normal Neck: no adenopathy, no carotid bruit, no JVD, supple, symmetrical, trachea midline and thyroid not enlarged, symmetric, no tenderness/mass/nodules Lungs: clear to  auscultation bilaterally Heart: regular rate and rhythm, S1, S2 normal, no murmur, click, rub or gallop Abdomen: soft, non-tender; bowel sounds normal; no masses,  no organomegaly Extremities: extremities normal, atraumatic, no cyanosis or edema Pulses: 2+ and symmetric Skin: Skin color, texture, turgor normal. No rashes or lesions Lymph nodes: Cervical, supraclavicular, and axillary nodes normal. Neurologic: Alert and oriented X 3, normal strength and tone. Normal symmetric reflexes. Normal coordination and gait    Assessment:    Healthy male exam.      Plan:     Anticipatory guidance given including wearing seatbelts, smoke detectors in the home,  increasing physical activity, increasing p.o. intake of water and vegetables. -pt declines labs including STI testing this visit. -Offered influenza vaccine.  Patient declines. -COVID-19 vaccine up-to-date per patient -Given handouts -Next CPE in 1 year -Form for work completed See After Visit Summary for Counseling Recommendations   Follow-up as needed  Abbe Amsterdam, MD

## 2020-06-21 NOTE — Telephone Encounter (Signed)
Form completed at visit

## 2022-03-21 DIAGNOSIS — Z0000 Encounter for general adult medical examination without abnormal findings: Secondary | ICD-10-CM | POA: Diagnosis not present

## 2022-03-21 DIAGNOSIS — Z13220 Encounter for screening for lipoid disorders: Secondary | ICD-10-CM | POA: Diagnosis not present

## 2022-03-21 DIAGNOSIS — J4520 Mild intermittent asthma, uncomplicated: Secondary | ICD-10-CM | POA: Diagnosis not present

## 2022-03-21 DIAGNOSIS — Z136 Encounter for screening for cardiovascular disorders: Secondary | ICD-10-CM | POA: Diagnosis not present
